# Patient Record
Sex: Female | Born: 1975 | Race: White | Hispanic: No | Marital: Married | Smoking: Never smoker
Health system: Southern US, Community
[De-identification: ages and names within clinical notes are randomized; demographics above are authoritative.]

## PROBLEM LIST (undated history)

## (undated) DIAGNOSIS — R002 Palpitations: Secondary | ICD-10-CM

## (undated) DIAGNOSIS — R000 Tachycardia, unspecified: Secondary | ICD-10-CM

## (undated) DIAGNOSIS — R945 Abnormal results of liver function studies: Secondary | ICD-10-CM

## (undated) DIAGNOSIS — J45909 Unspecified asthma, uncomplicated: Secondary | ICD-10-CM

## (undated) DIAGNOSIS — L680 Hirsutism: Secondary | ICD-10-CM

## (undated) DIAGNOSIS — K760 Fatty (change of) liver, not elsewhere classified: Secondary | ICD-10-CM

## (undated) DIAGNOSIS — K219 Gastro-esophageal reflux disease without esophagitis: Secondary | ICD-10-CM

## (undated) DIAGNOSIS — E538 Deficiency of other specified B group vitamins: Secondary | ICD-10-CM

## (undated) DIAGNOSIS — R7989 Other specified abnormal findings of blood chemistry: Secondary | ICD-10-CM

## (undated) DIAGNOSIS — F419 Anxiety disorder, unspecified: Secondary | ICD-10-CM

## (undated) DIAGNOSIS — N941 Unspecified dyspareunia: Secondary | ICD-10-CM

## (undated) HISTORY — DX: Gastro-esophageal reflux disease without esophagitis: K21.9

## (undated) HISTORY — PX: CHOLECYSTECTOMY: SHX55

## (undated) HISTORY — DX: Tachycardia, unspecified: R00.0

## (undated) HISTORY — DX: Fatty (change of) liver, not elsewhere classified: K76.0

## (undated) HISTORY — DX: Abnormal results of liver function studies: R94.5

## (undated) HISTORY — DX: Morbid (severe) obesity due to excess calories: E66.01

## (undated) HISTORY — DX: Other specified abnormal findings of blood chemistry: R79.89

## (undated) HISTORY — DX: Deficiency of other specified B group vitamins: E53.8

## (undated) HISTORY — DX: Unspecified dyspareunia: N94.10

## (undated) HISTORY — DX: Hirsutism: L68.0

## (undated) HISTORY — DX: Unspecified asthma, uncomplicated: J45.909

---

## 2002-01-10 ENCOUNTER — Ambulatory Visit (HOSPITAL_COMMUNITY): Admission: RE | Admit: 2002-01-10 | Discharge: 2002-01-10 | Payer: Self-pay | Admitting: Cardiology

## 2003-05-17 ENCOUNTER — Encounter: Admission: RE | Admit: 2003-05-17 | Discharge: 2003-05-17 | Payer: Self-pay | Admitting: Internal Medicine

## 2003-05-17 ENCOUNTER — Encounter: Payer: Self-pay | Admitting: Internal Medicine

## 2004-04-10 ENCOUNTER — Emergency Department (HOSPITAL_COMMUNITY): Admission: EM | Admit: 2004-04-10 | Discharge: 2004-04-10 | Payer: Self-pay | Admitting: *Deleted

## 2006-05-19 IMAGING — CR DG CHEST 2V
2 series · 2 of 2 positions shown · non-contrast
Comparison: none

CLINICAL DATA: Chest pain. 
 TWO-VIEW CHEST RADIOGRAPH, 04/10/04
 No prior studies.

[view not recorded (1 of 2)]
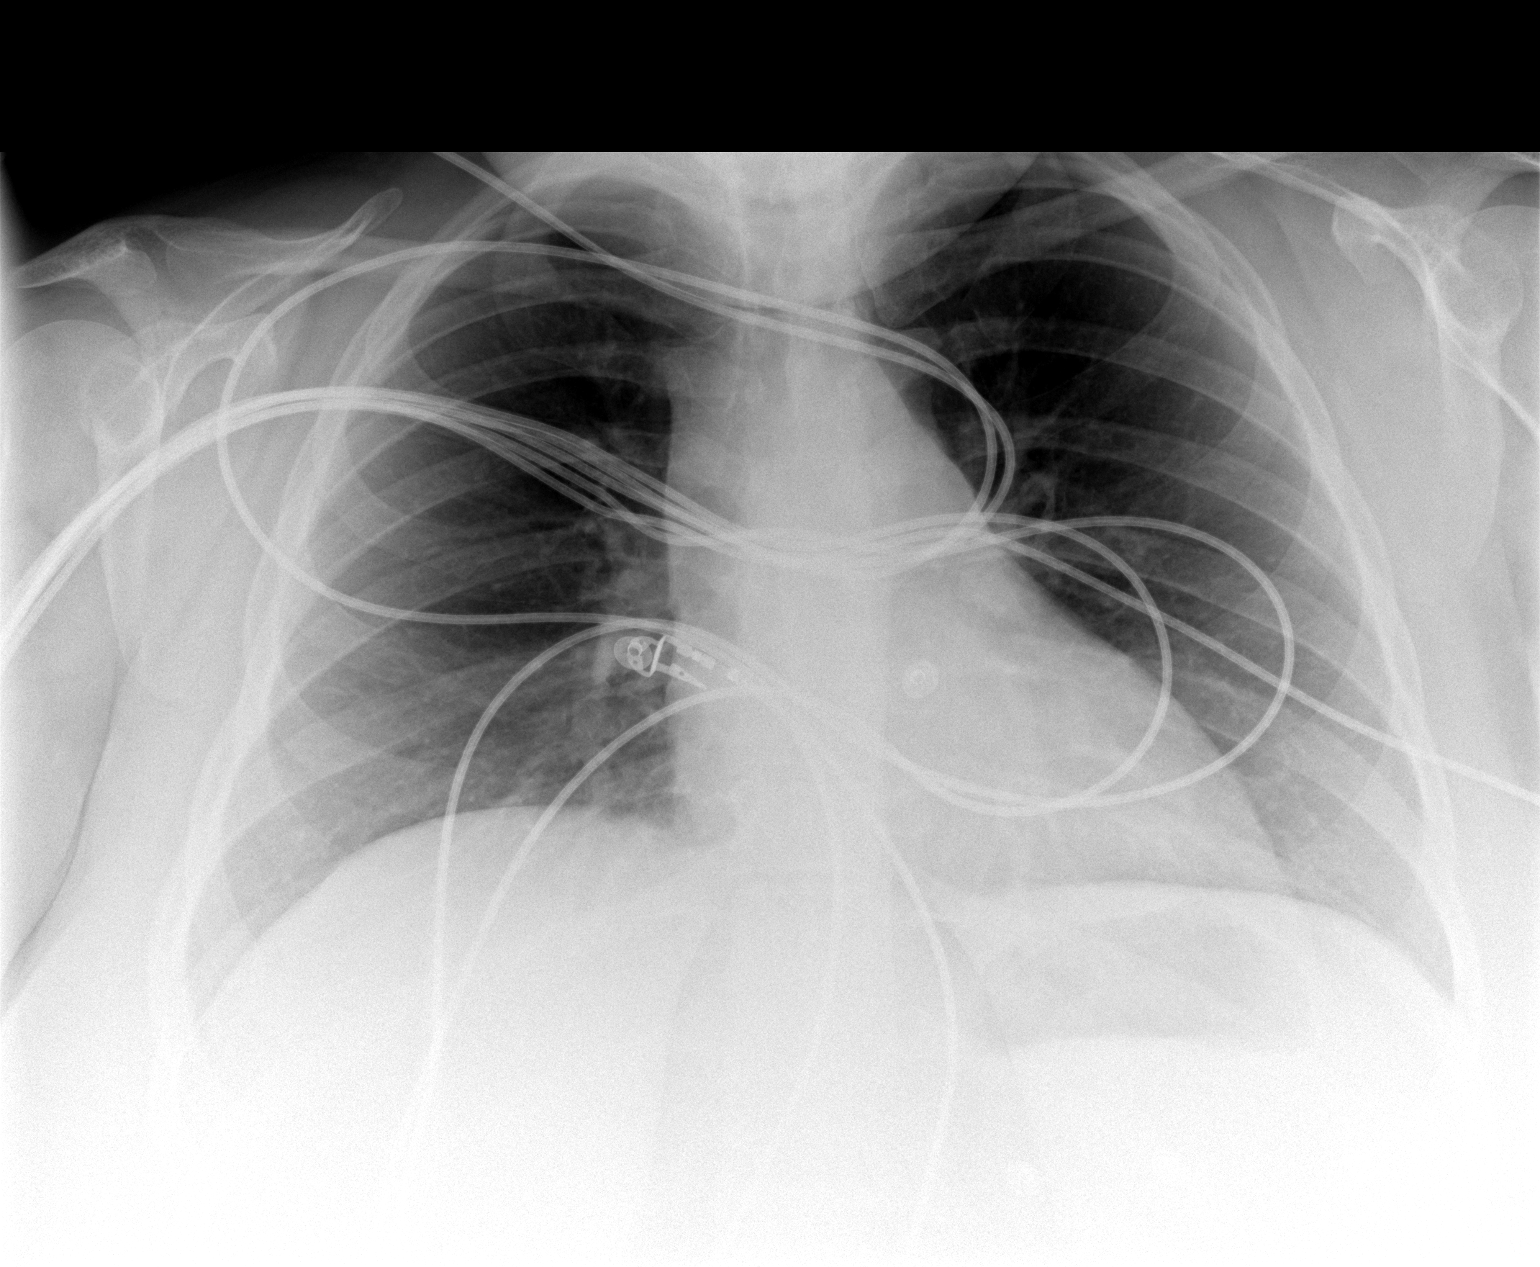

[view not recorded (2 of 2)]
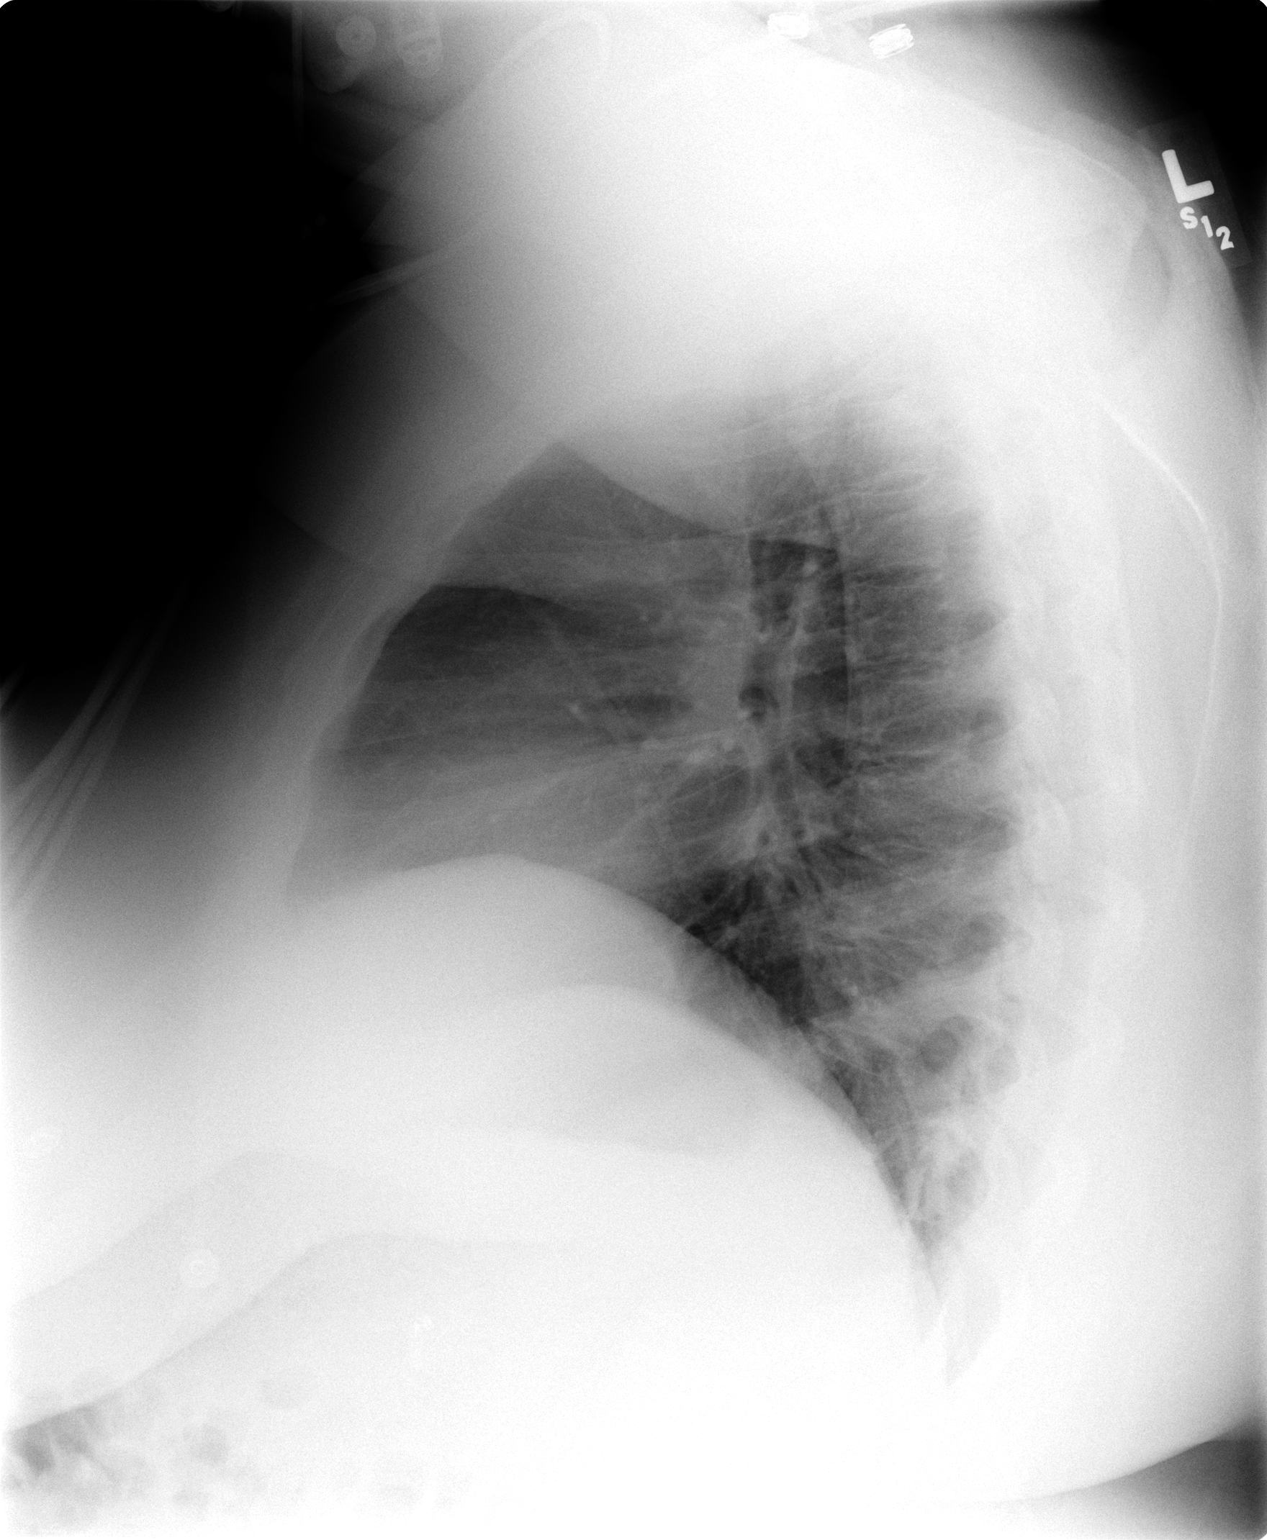

[2 of 2 positions shown; findings below may reference images not displayed]

FINDINGS: The heart size and mediastinal contours are normal. The lungs are clear. The visualized skeleton is unremarkable.
IMPRESSION: No active disease.

## 2016-08-17 DIAGNOSIS — R000 Tachycardia, unspecified: Secondary | ICD-10-CM | POA: Diagnosis not present

## 2016-08-17 DIAGNOSIS — K219 Gastro-esophageal reflux disease without esophagitis: Secondary | ICD-10-CM | POA: Diagnosis not present

## 2016-08-17 DIAGNOSIS — Z79899 Other long term (current) drug therapy: Secondary | ICD-10-CM | POA: Diagnosis not present

## 2016-08-17 DIAGNOSIS — F419 Anxiety disorder, unspecified: Secondary | ICD-10-CM | POA: Diagnosis not present

## 2016-08-17 DIAGNOSIS — N390 Urinary tract infection, site not specified: Secondary | ICD-10-CM | POA: Diagnosis not present

## 2016-08-31 ENCOUNTER — Other Ambulatory Visit: Payer: Self-pay

## 2016-08-31 ENCOUNTER — Emergency Department (HOSPITAL_COMMUNITY): Payer: BLUE CROSS/BLUE SHIELD

## 2016-08-31 ENCOUNTER — Encounter (HOSPITAL_COMMUNITY): Payer: Self-pay

## 2016-08-31 DIAGNOSIS — Z5321 Procedure and treatment not carried out due to patient leaving prior to being seen by health care provider: Secondary | ICD-10-CM | POA: Diagnosis not present

## 2016-08-31 DIAGNOSIS — E041 Nontoxic single thyroid nodule: Secondary | ICD-10-CM | POA: Diagnosis not present

## 2016-08-31 DIAGNOSIS — R079 Chest pain, unspecified: Secondary | ICD-10-CM | POA: Diagnosis not present

## 2016-08-31 DIAGNOSIS — R0789 Other chest pain: Secondary | ICD-10-CM | POA: Diagnosis not present

## 2016-08-31 DIAGNOSIS — R0600 Dyspnea, unspecified: Secondary | ICD-10-CM | POA: Diagnosis not present

## 2016-08-31 DIAGNOSIS — R0602 Shortness of breath: Secondary | ICD-10-CM | POA: Diagnosis not present

## 2016-08-31 LAB — CBC
HCT: 44.1 % (ref 36.0–46.0)
Hemoglobin: 14.6 g/dL (ref 12.0–15.0)
MCH: 29 pg (ref 26.0–34.0)
MCHC: 33.1 g/dL (ref 30.0–36.0)
MCV: 87.5 fL (ref 78.0–100.0)
PLATELETS: 287 10*3/uL (ref 150–400)
RBC: 5.04 MIL/uL (ref 3.87–5.11)
RDW: 14.1 % (ref 11.5–15.5)
WBC: 10 10*3/uL (ref 4.0–10.5)

## 2016-08-31 LAB — I-STAT TROPONIN, ED: Troponin i, poc: 0 ng/mL (ref 0.00–0.08)

## 2016-08-31 LAB — BASIC METABOLIC PANEL
Anion gap: 10 (ref 5–15)
BUN: 11 mg/dL (ref 6–20)
CHLORIDE: 108 mmol/L (ref 101–111)
CO2: 23 mmol/L (ref 22–32)
CREATININE: 0.88 mg/dL (ref 0.44–1.00)
Calcium: 9.5 mg/dL (ref 8.9–10.3)
GFR calc Af Amer: >60 mL/min (ref >60)
GFR calc non Af Amer: >60 mL/min (ref >60)
Glucose, Bld: 230 mg/dL — ABNORMAL HIGH (ref 65–99)
POTASSIUM: 3.7 mmol/L (ref 3.5–5.1)
SODIUM: 141 mmol/L (ref 135–145)

## 2016-08-31 NOTE — ED Triage Notes (Signed)
Pt complaining of central chest pain that radiates to L shoulder and neck. Pt states seen at [facility] for [facility]. Dx with chest wall pain. Pt states pain worsening today. Pt complaining of SOB. Pt denies any cough or injury/trauma.

## 2016-09-01 ENCOUNTER — Emergency Department (HOSPITAL_COMMUNITY)
Admission: EM | Admit: 2016-09-01 | Discharge: 2016-09-01 | Disposition: A | Discharge status: Home / Self Care | Payer: BLUE CROSS/BLUE SHIELD | Attending: Emergency Medicine | Admitting: Emergency Medicine

## 2016-09-01 HISTORY — DX: Palpitations: R00.2

## 2016-09-01 HISTORY — DX: Anxiety disorder, unspecified: F41.9

## 2016-09-03 DIAGNOSIS — Z6841 Body Mass Index (BMI) 40.0 and over, adult: Secondary | ICD-10-CM | POA: Diagnosis not present

## 2016-09-03 DIAGNOSIS — E041 Nontoxic single thyroid nodule: Secondary | ICD-10-CM | POA: Diagnosis not present

## 2016-09-03 DIAGNOSIS — Z1389 Encounter for screening for other disorder: Secondary | ICD-10-CM | POA: Diagnosis not present

## 2016-09-03 DIAGNOSIS — R0789 Other chest pain: Secondary | ICD-10-CM | POA: Diagnosis not present

## 2016-09-11 DIAGNOSIS — E042 Nontoxic multinodular goiter: Secondary | ICD-10-CM | POA: Diagnosis not present

## 2016-09-11 DIAGNOSIS — E041 Nontoxic single thyroid nodule: Secondary | ICD-10-CM | POA: Diagnosis not present

## 2016-10-01 DIAGNOSIS — E041 Nontoxic single thyroid nodule: Secondary | ICD-10-CM | POA: Diagnosis not present

## 2016-10-01 DIAGNOSIS — E042 Nontoxic multinodular goiter: Secondary | ICD-10-CM | POA: Diagnosis not present

## 2016-11-23 DIAGNOSIS — R062 Wheezing: Secondary | ICD-10-CM | POA: Diagnosis not present

## 2016-11-23 DIAGNOSIS — J209 Acute bronchitis, unspecified: Secondary | ICD-10-CM | POA: Diagnosis not present

## 2016-12-24 DIAGNOSIS — R5383 Other fatigue: Secondary | ICD-10-CM | POA: Diagnosis not present

## 2016-12-24 DIAGNOSIS — J4520 Mild intermittent asthma, uncomplicated: Secondary | ICD-10-CM | POA: Diagnosis not present

## 2016-12-24 DIAGNOSIS — E538 Deficiency of other specified B group vitamins: Secondary | ICD-10-CM | POA: Diagnosis not present

## 2016-12-24 DIAGNOSIS — N950 Postmenopausal bleeding: Secondary | ICD-10-CM | POA: Diagnosis not present

## 2016-12-25 DIAGNOSIS — E538 Deficiency of other specified B group vitamins: Secondary | ICD-10-CM | POA: Diagnosis not present

## 2016-12-25 DIAGNOSIS — R5383 Other fatigue: Secondary | ICD-10-CM | POA: Diagnosis not present

## 2017-01-22 DIAGNOSIS — K047 Periapical abscess without sinus: Secondary | ICD-10-CM | POA: Diagnosis not present

## 2017-01-22 DIAGNOSIS — Z6841 Body Mass Index (BMI) 40.0 and over, adult: Secondary | ICD-10-CM | POA: Diagnosis not present

## 2017-03-15 DIAGNOSIS — N939 Abnormal uterine and vaginal bleeding, unspecified: Secondary | ICD-10-CM | POA: Diagnosis not present

## 2017-03-29 DIAGNOSIS — N939 Abnormal uterine and vaginal bleeding, unspecified: Secondary | ICD-10-CM | POA: Diagnosis not present

## 2017-05-03 DIAGNOSIS — M79671 Pain in right foot: Secondary | ICD-10-CM | POA: Diagnosis not present

## 2017-05-04 DIAGNOSIS — M7731 Calcaneal spur, right foot: Secondary | ICD-10-CM | POA: Diagnosis not present

## 2017-05-04 DIAGNOSIS — M79671 Pain in right foot: Secondary | ICD-10-CM | POA: Diagnosis not present

## 2017-06-07 DIAGNOSIS — M79671 Pain in right foot: Secondary | ICD-10-CM | POA: Diagnosis not present

## 2017-07-15 DIAGNOSIS — H1033 Unspecified acute conjunctivitis, bilateral: Secondary | ICD-10-CM | POA: Diagnosis not present

## 2017-08-23 DIAGNOSIS — R0789 Other chest pain: Secondary | ICD-10-CM | POA: Diagnosis not present

## 2017-08-23 DIAGNOSIS — R000 Tachycardia, unspecified: Secondary | ICD-10-CM | POA: Diagnosis not present

## 2017-08-23 DIAGNOSIS — R5383 Other fatigue: Secondary | ICD-10-CM | POA: Diagnosis not present

## 2017-08-23 DIAGNOSIS — M791 Myalgia, unspecified site: Secondary | ICD-10-CM | POA: Diagnosis not present

## 2017-08-23 DIAGNOSIS — R635 Abnormal weight gain: Secondary | ICD-10-CM | POA: Diagnosis not present

## 2017-09-18 DIAGNOSIS — J111 Influenza due to unidentified influenza virus with other respiratory manifestations: Secondary | ICD-10-CM | POA: Diagnosis not present

## 2017-09-23 DIAGNOSIS — J4530 Mild persistent asthma, uncomplicated: Secondary | ICD-10-CM | POA: Diagnosis not present

## 2017-09-23 DIAGNOSIS — Z6841 Body Mass Index (BMI) 40.0 and over, adult: Secondary | ICD-10-CM | POA: Diagnosis not present

## 2017-09-23 DIAGNOSIS — R000 Tachycardia, unspecified: Secondary | ICD-10-CM | POA: Diagnosis not present

## 2017-09-23 DIAGNOSIS — R5383 Other fatigue: Secondary | ICD-10-CM | POA: Diagnosis not present

## 2017-09-23 DIAGNOSIS — Z1331 Encounter for screening for depression: Secondary | ICD-10-CM | POA: Diagnosis not present

## 2017-09-28 DIAGNOSIS — Z1231 Encounter for screening mammogram for malignant neoplasm of breast: Secondary | ICD-10-CM | POA: Diagnosis not present

## 2018-02-17 DIAGNOSIS — Z6841 Body Mass Index (BMI) 40.0 and over, adult: Secondary | ICD-10-CM | POA: Diagnosis not present

## 2018-02-17 DIAGNOSIS — F419 Anxiety disorder, unspecified: Secondary | ICD-10-CM | POA: Diagnosis not present

## 2018-02-17 DIAGNOSIS — R002 Palpitations: Secondary | ICD-10-CM | POA: Diagnosis not present

## 2018-02-17 DIAGNOSIS — J4530 Mild persistent asthma, uncomplicated: Secondary | ICD-10-CM | POA: Diagnosis not present

## 2018-02-28 ENCOUNTER — Encounter: Payer: Self-pay | Admitting: Cardiology

## 2018-03-04 DIAGNOSIS — N951 Menopausal and female climacteric states: Secondary | ICD-10-CM

## 2018-03-04 DIAGNOSIS — E538 Deficiency of other specified B group vitamins: Secondary | ICD-10-CM

## 2018-03-04 DIAGNOSIS — R7989 Other specified abnormal findings of blood chemistry: Secondary | ICD-10-CM | POA: Insufficient documentation

## 2018-03-04 DIAGNOSIS — R002 Palpitations: Secondary | ICD-10-CM

## 2018-03-04 DIAGNOSIS — R000 Tachycardia, unspecified: Secondary | ICD-10-CM

## 2018-03-04 DIAGNOSIS — N809 Endometriosis, unspecified: Secondary | ICD-10-CM

## 2018-03-04 DIAGNOSIS — K219 Gastro-esophageal reflux disease without esophagitis: Secondary | ICD-10-CM | POA: Insufficient documentation

## 2018-03-04 DIAGNOSIS — N941 Unspecified dyspareunia: Secondary | ICD-10-CM

## 2018-03-04 DIAGNOSIS — J4530 Mild persistent asthma, uncomplicated: Secondary | ICD-10-CM

## 2018-03-04 DIAGNOSIS — F419 Anxiety disorder, unspecified: Secondary | ICD-10-CM

## 2018-03-04 DIAGNOSIS — K760 Fatty (change of) liver, not elsewhere classified: Secondary | ICD-10-CM

## 2018-03-04 DIAGNOSIS — L680 Hirsutism: Secondary | ICD-10-CM

## 2018-03-04 DIAGNOSIS — R945 Abnormal results of liver function studies: Secondary | ICD-10-CM

## 2018-03-04 HISTORY — DX: Mild persistent asthma, uncomplicated: J45.30

## 2018-03-04 HISTORY — DX: Menopausal and female climacteric states: N95.1

## 2018-03-04 HISTORY — DX: Anxiety disorder, unspecified: F41.9

## 2018-03-04 HISTORY — DX: Fatty (change of) liver, not elsewhere classified: K76.0

## 2018-03-04 HISTORY — DX: Unspecified dyspareunia: N94.10

## 2018-03-04 HISTORY — DX: Palpitations: R00.2

## 2018-03-04 HISTORY — DX: Deficiency of other specified B group vitamins: E53.8

## 2018-03-04 HISTORY — DX: Endometriosis, unspecified: N80.9

## 2018-03-08 ENCOUNTER — Ambulatory Visit (INDEPENDENT_AMBULATORY_CARE_PROVIDER_SITE_OTHER): Payer: BLUE CROSS/BLUE SHIELD | Admitting: Cardiology

## 2018-03-08 ENCOUNTER — Encounter: Payer: Self-pay | Admitting: Cardiology

## 2018-03-08 VITALS — BP 126/72 | HR 65 | Ht 65.0 in | Wt 327.0 lb

## 2018-03-08 DIAGNOSIS — R002 Palpitations: Secondary | ICD-10-CM | POA: Diagnosis not present

## 2018-03-08 DIAGNOSIS — R079 Chest pain, unspecified: Secondary | ICD-10-CM

## 2018-03-08 HISTORY — DX: Palpitations: R00.2

## 2018-03-08 HISTORY — DX: Chest pain, unspecified: R07.9

## 2018-03-08 NOTE — Addendum Note (Signed)
Addended by: Lamona CurlOUTH, NICOLE H on: 03/08/2018 11:16 AM   Modules accepted: Orders

## 2018-03-08 NOTE — Patient Instructions (Addendum)
Medication Instructions:  Your physician recommends that you continue on your current medications as directed. Please refer to the Current Medication list given to you today.   Labwork: You will have a TSH today  Testing/Procedures: Your physician has requested that you have an echocardiogram. Echocardiography is a painless test that uses sound waves to create images of your heart. It provides your doctor with information about the size and shape of your heart and how well your heart's chambers and valves are working. This procedure takes approximately one hour. There are no restrictions for this procedure.  Your physician has recommended that you wear a holter monitor. Holter monitors are medical devices that record the heart's electrical activity. Doctors most often use these monitors to diagnose arrhythmias. Arrhythmias are problems with the speed or rhythm of the heartbeat. The monitor is a small, portable device. You can wear one while you do your normal daily activities. This is usually used to diagnose what is causing palpitations/syncope (passing out).  Your physician has requested that you have a lexiscan myoview. For further information please visit [email or web address]. Please follow instruction sheet, as given.  Stress Test Directions for [facility]: 1.) Please check in at the outpatient center at [facility] the day of your testing. 2.) Nothing to eat or drink after midnight prior to testing. You may take your medications that morning with water. 3.) Please be aware that the test can take up to 3-4 hours. This is a 2 day test process and you will follow the same instructions for both days.  4.) Should you have any problem with the appointment date or time, please call [phone].    Follow-Up: Your physician wants you to follow-up in: 6 months.  You will receive a reminder letter in the mail two months in advance. If you don't receive a letter, please call our office  to schedule the follow-up appointment.   Any Other Special Instructions Will Be Listed Below (If Applicable).     If you need a refill on your cardiac medications before your next appointment, please call your pharmacy.

## 2018-03-08 NOTE — Progress Notes (Signed)
Cardiology Office Note:    Date:  03/08/2018   ID:  Larkin Ina, DOB 1975-11-08, MRN [id number]  PCP:  Lonie Peak, PA-C  Cardiologist:  Garwin Brothers, MD   Referring MD: Lonie Peak, PA-C    ASSESSMENT:    1. Palpitations   2. Chest pain, unspecified type   3. Morbid obesity (HCC)    PLAN:    In order of problems listed above:  1. I discussed my findings with the patient at extensive length.  Primary prevention stressed with her.  Importance of compliance with diet and medication stressed and she vocalized understanding. 2. Her blood pressure is stable.  Diet was explained for obesity and risks of obesity explained and she is doing and trying her to reduce weight. 3. In view of her palpitations she will have a TSH and a 48-hour Holter monitor.  The last time she had a TSH was in January and it was fine.   4.  To evaluate chest pain she will undergo exercise stress Cardiolite. 5. Patient will be seen in follow-up appointment in 6 months or earlier if the patient has any concerns    Medication Adjustments/Labs and Tests Ordered: Current medicines are reviewed at length with the patient today.  Concerns regarding medicines are outlined above.  No orders of the defined types were placed in this encounter.  No orders of the defined types were placed in this encounter.    History of Present Illness:    Pamela Silva is a 42 y.o. female who is being seen today for the evaluation of palpitations and chest pain at the request of Lonie Peak, PA-C.  Patient is a pleasant 42 year old female.  She has past medical history of morbid obesity.  She is here because of palpitations.  She tells me that it happens to her on a daily basis and she is concerned about.  No orthopnea or PND.  She is trying to cut down on her soft drinks and reduce her weight.  She complains of occasional chest pain not related to exertion.  At the time of my evaluation, the patient is alert awake  oriented and in no distress.  Past Medical History:  Diagnosis Date  . Abnormal LFTs   . Anxiety   . Asthma   . Dyspareunia in female   . Esophageal reflux   . Fatty liver   . Female hirsutism   . Heart palpitations   . Morbid obesity (HCC)   . Sinus tachycardia   . Vitamin B 12 deficiency     Past Surgical History:  Procedure Laterality Date  . CHOLECYSTECTOMY      Current Medications: Current Meds  Medication Sig  . albuterol (PROAIR HFA) 108 (90 Base) MCG/ACT inhaler Inhale 2 puffs into the lungs every 4 (four) hours as needed.   . ALPRAZolam (XANAX) 0.5 MG tablet Take 0.5 mg by mouth at bedtime as needed.  Marland Kitchen FLOVENT HFA 110 MCG/ACT inhaler Inhale 2 puffs into the lungs 2 (two) times daily.  . fluticasone (FLONASE) 50 MCG/ACT nasal spray Place 2 sprays into both nostrils daily.  . metoprolol tartrate (LOPRESSOR) 25 MG tablet Take 1.5 tablets by mouth daily.  Marland Kitchen omeprazole (PRILOSEC) 40 MG capsule Take 40 mg by mouth daily.     Allergies:   Codeine; Hydrocodone-acetaminophen; and Morphine and related   Social History   Socioeconomic History  . Marital status: Married    Spouse name: Not on file  . Number of children:  Not on file  . Years of education: Not on file  . Highest education level: Not on file  Occupational History  . Not on file  Social Needs  . Financial resource strain: Not on file  . Food insecurity:    Worry: Not on file    Inability: Not on file  . Transportation needs:    Medical: Not on file    Non-medical: Not on file  Tobacco Use  . Smoking status: Never Smoker  . Smokeless tobacco: Never Used  Substance and Sexual Activity  . Alcohol use: No  . Drug use: No  . Sexual activity: Not on file  Lifestyle  . Physical activity:    Days per week: Not on file    Minutes per session: Not on file  . Stress: Not on file  Relationships  . Social connections:    Talks on phone: Not on file    Gets together: Not on file    Attends religious  service: Not on file    Active member of club or organization: Not on file    Attends meetings of clubs or organizations: Not on file    Relationship status: Not on file  Other Topics Concern  . Not on file  Social History Narrative  . Not on file     Family History: The patient's family history includes Heart attack in her mother; Lung cancer in her mother.  ROS:   Please see the history of present illness.    All other systems reviewed and are negative.  EKGs/Labs/Other Studies Reviewed:    The following studies were reviewed today: EKG reveals sinus rhythm with nonspecific ST changes   Recent Labs: No results found for requested labs within last 8760 hours.  Recent Lipid Panel No results found for: CHOL, TRIG, HDL, CHOLHDL, VLDL, LDLCALC, LDLDIRECT  Physical Exam:    VS:  BP 126/72 (BP Location: Right Arm, Patient Position: Sitting, Cuff Size: Normal)   Pulse 65   Ht 5\' 5"  (1.651 m)   Wt (!) 327 lb (148.3 kg)   SpO2 98%   BMI 54.42 kg/m     Wt Readings from Last 3 Encounters:  03/08/18 (!) 327 lb (148.3 kg)  02/18/18 (!) 338 lb (153.3 kg)     GEN: Patient is in no acute distress HEENT: Normal NECK: No JVD; No carotid bruits LYMPHATICS: No lymphadenopathy CARDIAC: S1 S2 regular, 2/6 systolic murmur at the apex. RESPIRATORY:  Clear to auscultation without rales, wheezing or rhonchi  ABDOMEN: Soft, non-tender, non-distended MUSCULOSKELETAL:  No edema; No deformity  SKIN: Warm and dry NEUROLOGIC:  Alert and oriented x 3 PSYCHIATRIC:  Normal affect    Signed, Garwin Brothersajan R Revankar, MD  03/08/2018 10:52 AM    [facility]

## 2018-03-09 LAB — TSH: TSH: 3.07 u[IU]/mL (ref 0.450–4.500)

## 2018-03-17 DIAGNOSIS — G5700 Lesion of sciatic nerve, unspecified lower limb: Secondary | ICD-10-CM | POA: Diagnosis not present

## 2018-04-06 DIAGNOSIS — R079 Chest pain, unspecified: Secondary | ICD-10-CM | POA: Diagnosis not present

## 2018-04-07 ENCOUNTER — Telehealth: Payer: Self-pay | Admitting: *Deleted

## 2018-04-07 NOTE — Telephone Encounter (Signed)
Informed patient of her myoview results.

## 2018-04-07 NOTE — Telephone Encounter (Signed)
Left voicemail for the patient to call the office. 

## 2018-04-07 NOTE — Telephone Encounter (Signed)
Pt would like results of stress test 

## 2018-04-13 ENCOUNTER — Ambulatory Visit (INDEPENDENT_AMBULATORY_CARE_PROVIDER_SITE_OTHER): Payer: BLUE CROSS/BLUE SHIELD

## 2018-04-13 DIAGNOSIS — R002 Palpitations: Secondary | ICD-10-CM | POA: Diagnosis not present

## 2018-04-15 ENCOUNTER — Ambulatory Visit (INDEPENDENT_AMBULATORY_CARE_PROVIDER_SITE_OTHER): Payer: BLUE CROSS/BLUE SHIELD

## 2018-04-15 DIAGNOSIS — R002 Palpitations: Secondary | ICD-10-CM

## 2018-04-15 NOTE — Progress Notes (Signed)
Complete echocardiogram has been performed. Ultrasound contrast use was needed, IV access was attempted x2 without success.   Jimmy Layana Konkel RDCS

## 2018-05-12 DIAGNOSIS — Z6841 Body Mass Index (BMI) 40.0 and over, adult: Secondary | ICD-10-CM | POA: Diagnosis not present

## 2018-05-12 DIAGNOSIS — J0100 Acute maxillary sinusitis, unspecified: Secondary | ICD-10-CM | POA: Diagnosis not present

## 2018-06-14 DIAGNOSIS — Z23 Encounter for immunization: Secondary | ICD-10-CM | POA: Diagnosis not present

## 2018-06-14 DIAGNOSIS — Z6841 Body Mass Index (BMI) 40.0 and over, adult: Secondary | ICD-10-CM | POA: Diagnosis not present

## 2018-06-14 DIAGNOSIS — R002 Palpitations: Secondary | ICD-10-CM | POA: Diagnosis not present

## 2018-08-31 DIAGNOSIS — I10 Essential (primary) hypertension: Secondary | ICD-10-CM | POA: Diagnosis not present

## 2018-08-31 DIAGNOSIS — K219 Gastro-esophageal reflux disease without esophagitis: Secondary | ICD-10-CM | POA: Diagnosis not present

## 2018-08-31 DIAGNOSIS — Z8639 Personal history of other endocrine, nutritional and metabolic disease: Secondary | ICD-10-CM | POA: Diagnosis not present

## 2018-08-31 DIAGNOSIS — E782 Mixed hyperlipidemia: Secondary | ICD-10-CM | POA: Diagnosis not present

## 2018-08-31 DIAGNOSIS — R5381 Other malaise: Secondary | ICD-10-CM | POA: Diagnosis not present

## 2018-08-31 DIAGNOSIS — Z0000 Encounter for general adult medical examination without abnormal findings: Secondary | ICD-10-CM | POA: Diagnosis not present

## 2018-08-31 DIAGNOSIS — R5383 Other fatigue: Secondary | ICD-10-CM | POA: Diagnosis not present

## 2018-09-28 DIAGNOSIS — L600 Ingrowing nail: Secondary | ICD-10-CM | POA: Diagnosis not present

## 2018-09-28 DIAGNOSIS — M722 Plantar fascial fibromatosis: Secondary | ICD-10-CM | POA: Diagnosis not present

## 2018-09-28 DIAGNOSIS — L608 Other nail disorders: Secondary | ICD-10-CM | POA: Diagnosis not present

## 2018-10-03 DIAGNOSIS — R1031 Right lower quadrant pain: Secondary | ICD-10-CM | POA: Diagnosis not present

## 2018-10-03 DIAGNOSIS — Z79899 Other long term (current) drug therapy: Secondary | ICD-10-CM | POA: Diagnosis not present

## 2018-10-03 DIAGNOSIS — R112 Nausea with vomiting, unspecified: Secondary | ICD-10-CM | POA: Diagnosis not present

## 2018-10-03 DIAGNOSIS — M545 Low back pain: Secondary | ICD-10-CM | POA: Diagnosis not present

## 2018-10-03 DIAGNOSIS — J45909 Unspecified asthma, uncomplicated: Secondary | ICD-10-CM | POA: Diagnosis not present

## 2018-10-03 DIAGNOSIS — R109 Unspecified abdominal pain: Secondary | ICD-10-CM | POA: Diagnosis not present

## 2018-10-09 IMAGING — CR DG CHEST 2V
2 series · 2 of 2 positions shown · non-contrast
Comparison: 08/31/1998 any

CLINICAL DATA: Obesity, chest pain

EXAM:
CHEST  2 VIEW

[chest pa]
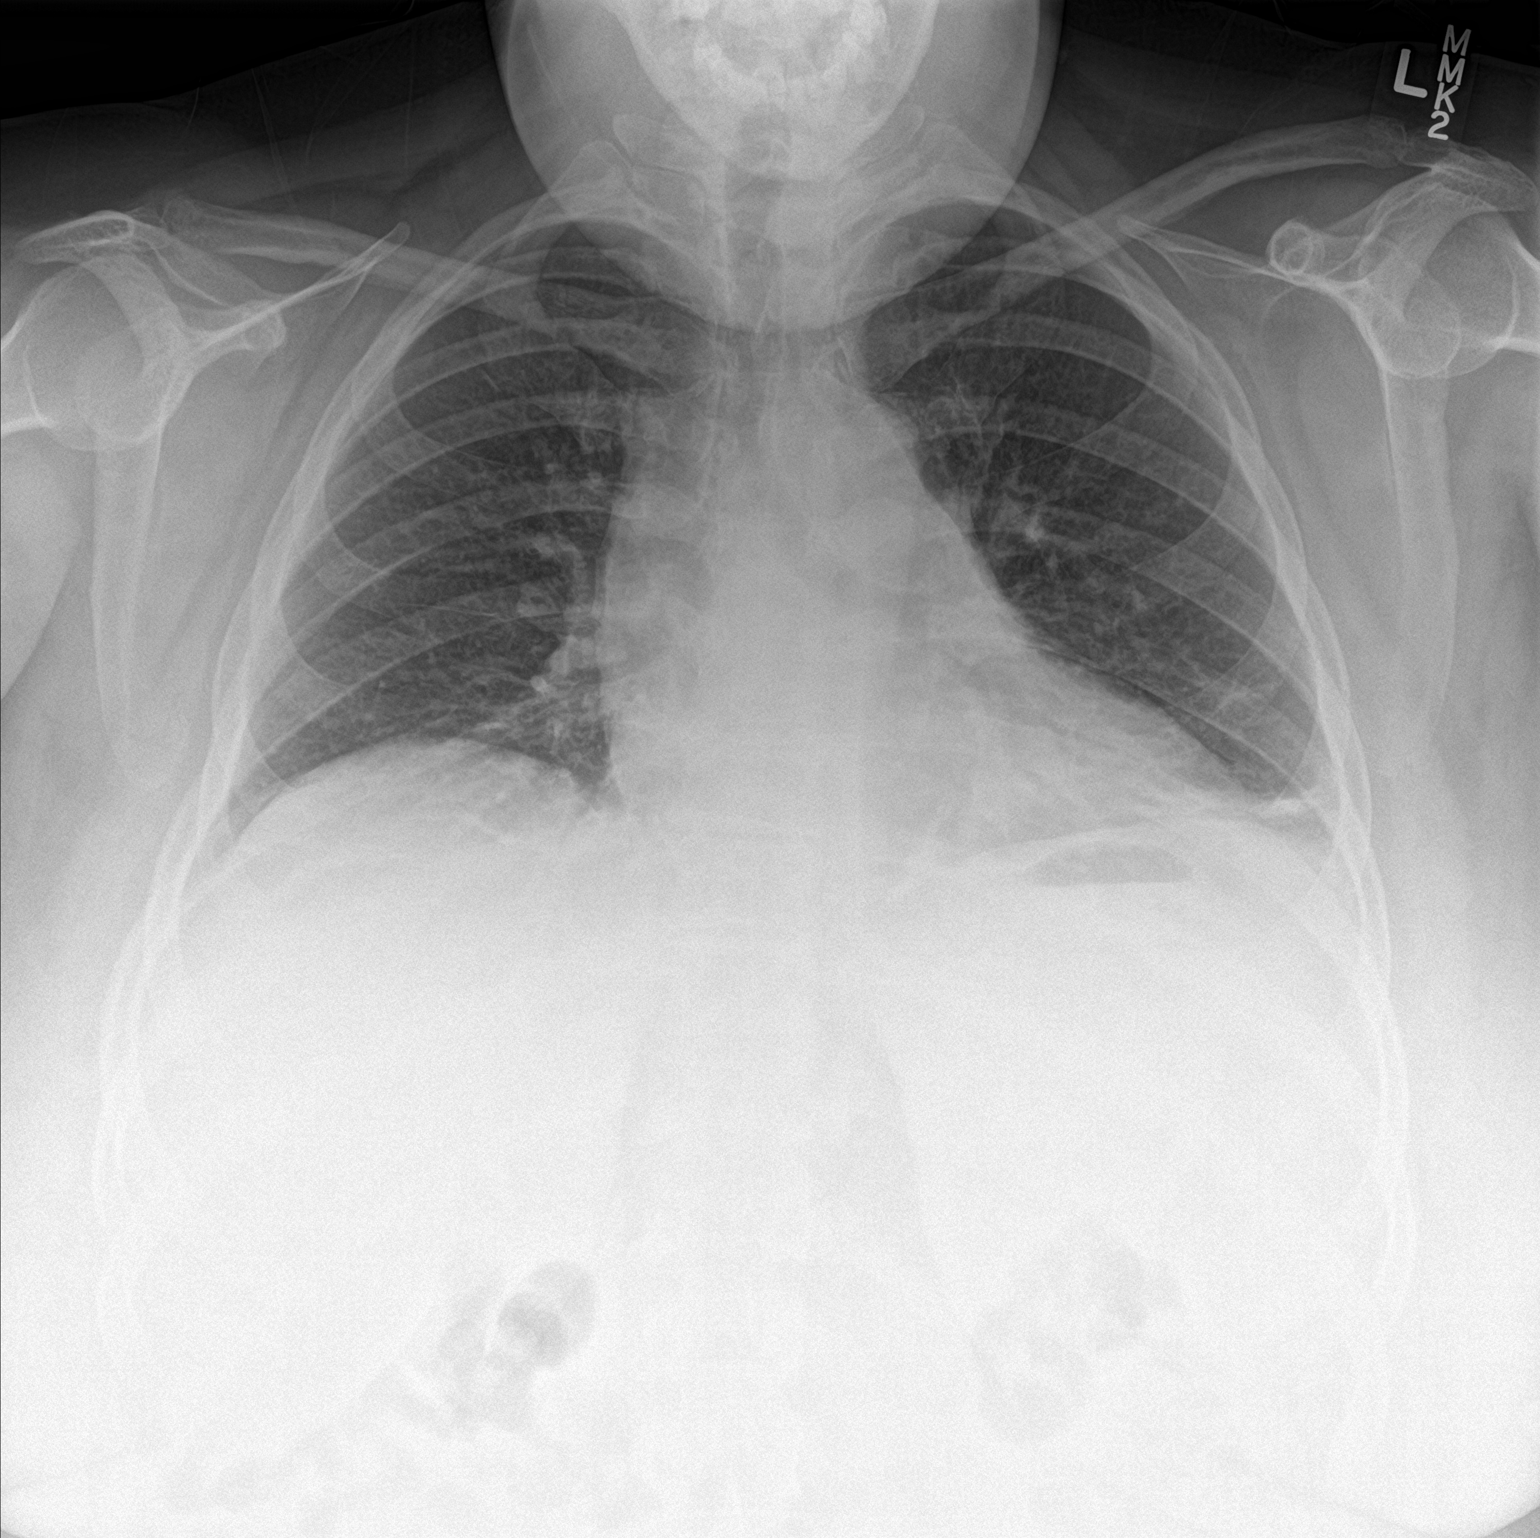

[chest lat]
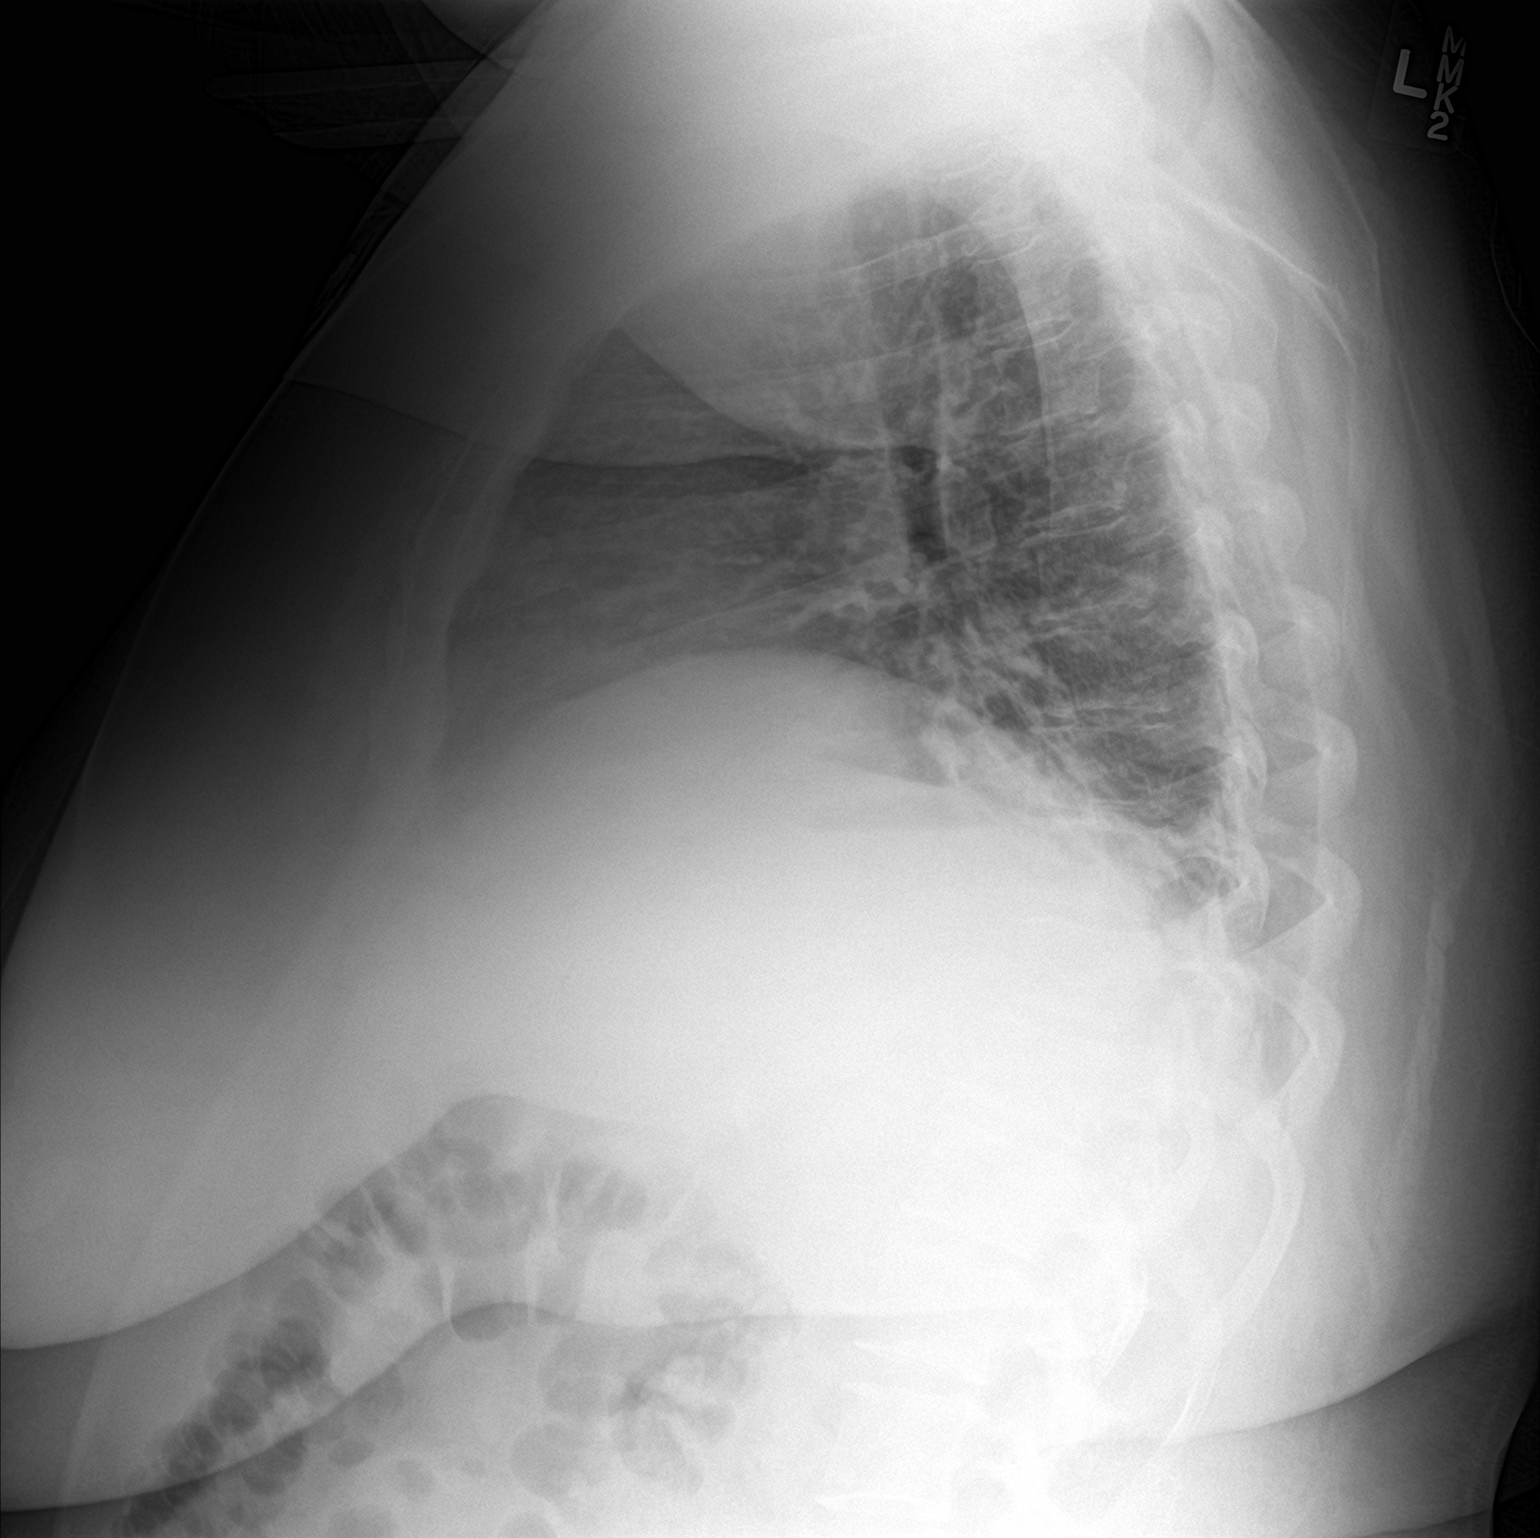

[2 of 2 positions shown; findings below may reference images not displayed]

FINDINGS: Persistent low lung volumes with left greater than right basilar
atelectasis. Mild cardiac enlargement. No definite focal pneumonia,
collapse, consolidation, edema, effusion or pneumothorax. Trachea is
midline. No acute osseous finding. Normal bowel gas pattern.
IMPRESSION: Low volume exam with bibasilar atelectasis.

Cardiomegaly without CHF

## 2018-10-25 DIAGNOSIS — H5500 Unspecified nystagmus: Secondary | ICD-10-CM | POA: Diagnosis not present

## 2018-10-25 DIAGNOSIS — B9789 Other viral agents as the cause of diseases classified elsewhere: Secondary | ICD-10-CM | POA: Diagnosis not present

## 2018-10-25 DIAGNOSIS — J988 Other specified respiratory disorders: Secondary | ICD-10-CM | POA: Diagnosis not present

## 2018-10-25 DIAGNOSIS — R42 Dizziness and giddiness: Secondary | ICD-10-CM | POA: Diagnosis not present

## 2018-10-25 DIAGNOSIS — B349 Viral infection, unspecified: Secondary | ICD-10-CM | POA: Diagnosis not present

## 2018-10-25 DIAGNOSIS — R112 Nausea with vomiting, unspecified: Secondary | ICD-10-CM | POA: Diagnosis not present

## 2018-10-26 DIAGNOSIS — R42 Dizziness and giddiness: Secondary | ICD-10-CM | POA: Diagnosis not present

## 2018-10-30 DIAGNOSIS — I639 Cerebral infarction, unspecified: Secondary | ICD-10-CM | POA: Diagnosis not present

## 2018-10-30 DIAGNOSIS — H532 Diplopia: Secondary | ICD-10-CM | POA: Diagnosis not present

## 2018-10-30 DIAGNOSIS — I6350 Cerebral infarction due to unspecified occlusion or stenosis of unspecified cerebral artery: Secondary | ICD-10-CM | POA: Diagnosis not present

## 2018-10-30 DIAGNOSIS — G935 Compression of brain: Secondary | ICD-10-CM | POA: Diagnosis not present

## 2018-10-30 DIAGNOSIS — G529 Cranial nerve disorder, unspecified: Secondary | ICD-10-CM | POA: Diagnosis not present

## 2018-10-30 DIAGNOSIS — R51 Headache: Secondary | ICD-10-CM | POA: Diagnosis not present

## 2018-10-30 DIAGNOSIS — R42 Dizziness and giddiness: Secondary | ICD-10-CM | POA: Diagnosis not present

## 2018-10-31 DIAGNOSIS — Z7189 Other specified counseling: Secondary | ICD-10-CM | POA: Diagnosis not present

## 2018-10-31 DIAGNOSIS — Z20828 Contact with and (suspected) exposure to other viral communicable diseases: Secondary | ICD-10-CM | POA: Diagnosis not present

## 2018-10-31 DIAGNOSIS — E782 Mixed hyperlipidemia: Secondary | ICD-10-CM | POA: Diagnosis not present

## 2018-10-31 DIAGNOSIS — I10 Essential (primary) hypertension: Secondary | ICD-10-CM | POA: Diagnosis not present

## 2018-11-23 DIAGNOSIS — H5500 Unspecified nystagmus: Secondary | ICD-10-CM | POA: Diagnosis not present

## 2018-11-23 DIAGNOSIS — G43109 Migraine with aura, not intractable, without status migrainosus: Secondary | ICD-10-CM | POA: Diagnosis not present

## 2018-11-23 DIAGNOSIS — F419 Anxiety disorder, unspecified: Secondary | ICD-10-CM | POA: Diagnosis not present

## 2018-11-23 DIAGNOSIS — H532 Diplopia: Secondary | ICD-10-CM | POA: Diagnosis not present

## 2018-11-30 ENCOUNTER — Telehealth: Payer: Self-pay | Admitting: Cardiology

## 2018-11-30 NOTE — Telephone Encounter (Signed)
Virtual Visit Pre-Appointment Phone Call  "(Name), I am calling you today to discuss your upcoming appointment. We are currently trying to limit exposure to the virus that causes COVID-19 by seeing patients at home rather than in the office."  1. "What is the BEST phone number to call the day of the visit?" - include this in appointment notes  2. Do you have or have access to (through a family member/friend) a smartphone with video capability that we can use for your visit?" a. If yes - list this number in appt notes as cell (if different from BEST phone #) and list the appointment type as a VIDEO visit in appointment notes b. If no - list the appointment type as a PHONE visit in appointment notes  3. Confirm consent - "In the setting of the current Covid19 crisis, you are scheduled for a (phone or video) visit with your provider on (date) at (time).  Just as we do with many in-office visits, in order for you to participate in this visit, we must obtain consent.  If you'd like, I can send this to your mychart (if signed up) or email for you to review.  Otherwise, I can obtain your verbal consent now.  All virtual visits are billed to your insurance company just like a normal visit would be.  By agreeing to a virtual visit, we'd like you to understand that the technology does not allow for your provider to perform an examination, and thus may limit your provider's ability to fully assess your condition. If your provider identifies any concerns that need to be evaluated in person, we will make arrangements to do so.  Finally, though the technology is pretty good, we cannot assure that it will always work on either your or our end, and in the setting of a video visit, we may have to convert it to a phone-only visit.  In either situation, we cannot ensure that we have a secure connection.  Are you willing to proceed?" STAFF: Did the patient verbally acknowledge consent to telehealth visit? Document  YES/NO here: YES  4. Advise patient to be prepared - "Two hours prior to your appointment, go ahead and check your blood pressure, pulse, oxygen saturation, and your weight (if you have the equipment to check those) and write them all down. When your visit starts, your provider will ask you for this information. If you have an Apple Watch or Kardia device, please plan to have heart rate information ready on the day of your appointment. Please have a pen and paper handy nearby the day of the visit as well."  5. Give patient instructions for MyChart download to smartphone OR [email or web address].me as below if video visit (depending on what platform provider is using)  6. Inform patient they will receive a phone call 15 minutes prior to their appointment time (may be from unknown caller ID) so they should be prepared to answer    TELEPHONE CALL NOTE  Pamela Silva has been deemed a candidate for a follow-up tele-health visit to limit community exposure during the Covid-19 pandemic. I spoke with the patient via phone to ensure availability of phone/video source, confirm preferred email & phone number, and discuss instructions and expectations.  I reminded Pamela InaLisa A Silva to be prepared with any vital sign and/or heart rhythm information that could potentially be obtained via home monitoring, at the time of her visit. I reminded Pamela Silva to expect a phone call prior to  her visit.  ZAMYA KREUSER 11/30/2018 10:13 AM   INSTRUCTIONS FOR DOWNLOADING THE MYCHART APP TO SMARTPHONE  - The patient must first make sure to have activated MyChart and know their login information - If Apple, go to [employer] and type in MyChart in the search bar and download the app. If Android, ask patient to go to [employer] and type in [place] in the search bar and download the app. The app is free but as with any other app downloads, their phone may require them to verify saved payment information or Apple/Android  password.  - The patient will need to then log into the app with their MyChart username and password, and select [facility] as their healthcare provider to link the account. When it is time for your visit, go to the MyChart app, find appointments, and click Begin Video Visit. Be sure to Select Allow for your device to access the Microphone and Camera for your visit. You will then be connected, and your provider will be with you shortly.  **If they have any issues connecting, or need assistance please contact MyChart service desk [phone])**  **If using a computer, in order to ensure the best quality for their visit they will need to use either of the following Internet Browsers: [employer] or Google Chrome**  IF USING DOXIMITY or DOXY.ME - The patient will receive a link just prior to their visit by text.     FULL LENGTH CONSENT FOR TELE-HEALTH VISIT   I hereby voluntarily request, consent and authorize [facility] and its employed or contracted physicians, physician assistants, nurse practitioners or other licensed health care professionals (the Practitioner), to provide me with telemedicine health care services (the Services") as deemed necessary by the treating Practitioner. I acknowledge and consent to receive the Services by the Practitioner via telemedicine. I understand that the telemedicine visit will involve communicating with the Practitioner through live audiovisual communication technology and the disclosure of certain medical information by electronic transmission. I acknowledge that I have been given the opportunity to request an in-person assessment or other available alternative prior to the telemedicine visit and am voluntarily participating in the telemedicine visit.  I understand that I have the right to withhold or withdraw my consent to the use of telemedicine in the course of my care at any time, without affecting my right to future care or treatment,  and that the Practitioner or I may terminate the telemedicine visit at any time. I understand that I have the right to inspect all information obtained and/or recorded in the course of the telemedicine visit and may receive copies of available information for a reasonable fee.  I understand that some of the potential risks of receiving the Services via telemedicine include:   Delay or interruption in medical evaluation due to technological equipment failure or disruption;  Information transmitted may not be sufficient (e.g. poor resolution of images) to allow for appropriate medical decision making by the Practitioner; and/or   In rare instances, security protocols could fail, causing a breach of personal health information.  Furthermore, I acknowledge that it is my responsibility to provide information about my medical history, conditions and care that is complete and accurate to the best of my ability. I acknowledge that Practitioner's advice, recommendations, and/or decision may be based on factors not within their control, such as incomplete or inaccurate data provided by me or distortions of diagnostic images or specimens that may result from electronic transmissions. I  understand that the practice of medicine is not an exact science and that Practitioner makes no warranties or guarantees regarding treatment outcomes. I acknowledge that I will receive a copy of this consent concurrently upon execution via email to the email address I last provided but may also request a printed copy by calling the office of [place].    I understand that my insurance will be billed for this visit.   I have read or had this consent read to me.  I understand the contents of this consent, which adequately explains the benefits and risks of the Services being provided via telemedicine.   I have been provided ample opportunity to ask questions regarding this consent and the Services and have had my questions  answered to my satisfaction.  I give my informed consent for the services to be provided through the use of telemedicine in my medical care  By participating in this telemedicine visit I agree to the above.

## 2018-12-02 ENCOUNTER — Encounter: Payer: Self-pay | Admitting: Cardiology

## 2018-12-02 ENCOUNTER — Other Ambulatory Visit: Payer: Self-pay

## 2018-12-02 ENCOUNTER — Telehealth (INDEPENDENT_AMBULATORY_CARE_PROVIDER_SITE_OTHER): Payer: BLUE CROSS/BLUE SHIELD | Admitting: Cardiology

## 2018-12-02 VITALS — Ht 65.0 in | Wt 307.0 lb

## 2018-12-02 DIAGNOSIS — R002 Palpitations: Secondary | ICD-10-CM | POA: Diagnosis not present

## 2018-12-02 MED ORDER — METOPROLOL TARTRATE 25 MG PO TABS: 25.0000 mg | ORAL_TABLET | Freq: Every day | ORAL | 2 refills | Status: DC

## 2018-12-02 NOTE — Patient Instructions (Signed)
Medication Instructions:  INCREASE metoprolol to 25 mg (1 tablet) daily  If you need a refill on your cardiac medications before your next appointment, please call your pharmacy.   Lab work: NONE If you have labs (blood work) drawn today and your tests are completely normal, you will receive your results only by: Marland Kitchen MyChart Message (if you have MyChart) OR . A paper copy in the mail If you have any lab test that is abnormal or we need to change your treatment, we will call you to review the results.  Testing/Procedures: NONE  Follow-Up: At [facility], you and your health needs are our priority.  As part of our continuing mission to provide you with exceptional heart care, we have created designated Provider Care Teams.  These Care Teams include your primary Cardiologist (physician) and Advanced Practice Providers (APPs -  Physician Assistants and Nurse Practitioners) who all work together to provide you with the care you need, when you need it. You will need a follow up appointment in 3 months.

## 2018-12-02 NOTE — Addendum Note (Signed)
Addended by: Pamala Hurry on: 12/02/2018 09:45 AM   Modules accepted: Orders

## 2018-12-02 NOTE — Progress Notes (Signed)
Virtual Visit via Video Note   This visit type was conducted due to national recommendations for restrictions regarding the COVID-19 Pandemic (e.g. social distancing) in an effort to limit this patient's exposure and mitigate transmission in our community.  Due to her co-morbid illnesses, this patient is at least at moderate risk for complications without adequate follow up.  This format is felt to be most appropriate for this patient at this time.  All issues noted in this document were discussed and addressed.  A limited physical exam was performed with this format.  Please refer to the patient's chart for her consent to telehealth for [facility].   Date:  12/02/2018   ID:  Pamela Silva, DOB 01/30/1976, MRN [id number]  Patient Location: Home Provider Location: Home  PCP:  Lonie Peakonroy, Nathan, PA-C  Cardiologist:  No primary care provider on file.  Electrophysiologist:  None   Evaluation Performed:  Follow-Up Visit  Chief Complaint: Palpitations  History of Present Illness:    Pamela InaLisa A Lomas is a 43 y.o. female with history of essential hypertension and morbid obesity she was evaluated for shortness of breath and palpitations.  Her echocardiogram stress test and monitoring was unremarkable.  She is on beta-blocker therapy and is doing fine.  She is under stress because of the current situation and health problems.  Works at a daycare center.  At the time of my evaluation, the patient is alert awake oriented and in no distress.  She complains of palpitations on and off whenever she is stressed beta-blockers helpful  The patient does not have symptoms concerning for COVID-19 infection (fever, chills, cough, or new shortness of breath).    Past Medical History:  Diagnosis Date  . Abnormal LFTs   . Anxiety   . Asthma   . Dyspareunia in female   . Esophageal reflux   . Fatty liver   . Female hirsutism   . Heart palpitations   . Morbid obesity (HCC)   . Sinus tachycardia   . Vitamin  B 12 deficiency    Past Surgical History:  Procedure Laterality Date  . CHOLECYSTECTOMY       Current Meds  Medication Sig  . albuterol (PROAIR HFA) 108 (90 Base) MCG/ACT inhaler Inhale 2 puffs into the lungs every 4 (four) hours as needed.   . ALPRAZolam (XANAX) 0.5 MG tablet Take 0.5 mg by mouth at bedtime as needed.  Marland Kitchen. aspirin EC 81 MG tablet Take 81 mg by mouth daily.  Marland Kitchen. FLOVENT HFA 110 MCG/ACT inhaler Inhale 2 puffs into the lungs 2 (two) times daily.  . fluticasone (FLONASE) 50 MCG/ACT nasal spray Place 2 sprays into both nostrils daily.  . metoprolol tartrate (LOPRESSOR) 25 MG tablet Take 1.5 tablets by mouth daily.  Marland Kitchen. omeprazole (PRILOSEC) 40 MG capsule Take 40 mg by mouth daily.     Allergies:   Codeine; Hydrocodone-acetaminophen; and Morphine and related   Social History   Tobacco Use  . Smoking status: Never Smoker  . Smokeless tobacco: Never Used  Substance Use Topics  . Alcohol use: No  . Drug use: No     Family Hx: The patient's family history includes Heart attack in her mother; Lung cancer in her mother.  ROS:   Please see the history of present illness.    As mentioned above All other systems reviewed and are negative.   Prior CV studies:   The following studies were reviewed today:  The results of the echocardiogram stress test and  Holter were discussed with the patient  Labs/Other Tests and Data Reviewed:    EKG:  No ECG reviewed.  Recent Labs: 03/08/2018: TSH 3.070   Recent Lipid Panel No results found for: CHOL, TRIG, HDL, CHOLHDL, LDLCALC, LDLDIRECT  Wt Readings from Last 3 Encounters:  12/02/18 (!) 307 lb (139.3 kg)  03/08/18 (!) 327 lb (148.3 kg)  02/18/18 (!) 338 lb (153.3 kg)     Objective:    Vital Signs:  Ht 5\' 5"  (1.651 m)   Wt (!) 307 lb (139.3 kg)   BMI 51.09 kg/m    VITAL SIGNS:  reviewed  ASSESSMENT & PLAN:    1. Palpitations: Patient is taking 12.5 mg of metoprolol.  I have asked her to increase it to 25 mg daily  and keep herself well-hydrated.  I think an element of stress is involved in her symptomatology.  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 2. Essential hypertension: Her blood pressure is stable 3. Morbid obesity: Diet was discussed and the importance of being very aggressive with diet was suggested to her.  I also told her to exercise to the best of her ability. 4. Patient will be seen in follow-up appointment in 3 months or earlier if the patient has any concerns   COVID-19 Education: The signs and symptoms of COVID-19 were discussed with the patient and how to seek care for testing (follow up with PCP or arrange E-visit).  The importance of social distancing was discussed today.  Time:   Today, I have spent 16 minutes with the patient with telehealth technology discussing the above problems.     Medication Adjustments/Labs and Tests Ordered: Current medicines are reviewed at length with the patient today.  Concerns regarding medicines are outlined above.   Tests Ordered: No orders of the defined types were placed in this encounter.   Medication Changes: No orders of the defined types were placed in this encounter.   Disposition:  Follow up in 3 month(s)  Signed, Garwin Brothers, MD  12/02/2018 8:47 AM    [facility]

## 2019-02-25 DIAGNOSIS — Z1231 Encounter for screening mammogram for malignant neoplasm of breast: Secondary | ICD-10-CM | POA: Diagnosis not present

## 2019-03-06 ENCOUNTER — Ambulatory Visit (INDEPENDENT_AMBULATORY_CARE_PROVIDER_SITE_OTHER): Payer: BLUE CROSS/BLUE SHIELD | Admitting: Cardiology

## 2019-03-06 ENCOUNTER — Encounter: Payer: Self-pay | Admitting: Cardiology

## 2019-03-06 ENCOUNTER — Other Ambulatory Visit: Payer: Self-pay

## 2019-03-06 VITALS — HR 84 | Ht 65.0 in | Wt 330.0 lb

## 2019-03-06 DIAGNOSIS — R079 Chest pain, unspecified: Secondary | ICD-10-CM

## 2019-03-06 DIAGNOSIS — R002 Palpitations: Secondary | ICD-10-CM

## 2019-03-06 NOTE — Progress Notes (Signed)
Cardiology Office Note:    Date:  03/06/2019   ID:  Pamela Silva, DOB Apr 12, 1976, MRN [id number]  PCP:  Cyndi Bender, PA-C  Cardiologist:  Jenean Lindau, MD   Referring MD: Cyndi Bender, PA-C    ASSESSMENT:    1. Chest pain, unspecified type   2. Palpitations    PLAN:    In order of problems listed above:  1. Abnormal stress test: Primary prevention stressed with the patient.  Importance of compliance with diet and medication stressed and she vocalized understanding.  She is trying to exercise on a regular basis now.  She is against any further evaluation.  She is currently asymptomatic other than being not in the best shape and having shortness of breath when she exerts.  I discussed with the benefits of further evaluation and she has not keen on it at this time. 2. Essential hypertension: Blood pressure stable 3. I told her to have a blood work done by primary care physician for lipids.  She might need therapy in view of abnormal stress test and she agrees. 4. Patient will be seen in follow-up appointment in 2 months or earlier if the patient has any concerns    Medication Adjustments/Labs and Tests Ordered: Current medicines are reviewed at length with the patient today.  Concerns regarding medicines are outlined above.  No orders of the defined types were placed in this encounter.  No orders of the defined types were placed in this encounter.    No chief complaint on file.    History of Present Illness:    Pamela Silva is a 43 y.o. female.  Patient was evaluated by me for abnormal stress testing.  She is significantly overweight and leads a sedentary lifestyle.  She declined any further testing.  She has been active now she just has shortness of breath on exertion.  No chest pain orthopnea or PND.  At the time of my evaluation, the patient is alert awake oriented and in no distress.  Past Medical History:  Diagnosis Date  . Abnormal LFTs   . Anxiety   .  Asthma   . Dyspareunia in female   . Esophageal reflux   . Fatty liver   . Female hirsutism   . Heart palpitations   . Morbid obesity ([place])   . Sinus tachycardia   . Vitamin B 12 deficiency     Past Surgical History:  Procedure Laterality Date  . CHOLECYSTECTOMY      Current Medications: Current Meds  Medication Sig  . albuterol (PROAIR HFA) 108 (90 Base) MCG/ACT inhaler Inhale 2 puffs into the lungs every 4 (four) hours as needed.   . ALPRAZolam (XANAX) 0.5 MG tablet Take 0.5 mg by mouth at bedtime as needed.  Marland Kitchen aspirin EC 81 MG tablet Take 81 mg by mouth daily.  Marland Kitchen FLOVENT HFA 110 MCG/ACT inhaler Inhale 2 puffs into the lungs 2 (two) times daily.  . fluticasone (FLONASE) 50 MCG/ACT nasal spray Place 2 sprays into both nostrils daily.  . metoprolol succinate (TOPROL-XL) 25 MG 24 hr tablet Take 25 mg by mouth daily.  Marland Kitchen omeprazole (PRILOSEC) 40 MG capsule Take 40 mg by mouth daily.  . [DISCONTINUED] metoprolol tartrate (LOPRESSOR) 25 MG tablet Take 1 tablet (25 mg total) by mouth daily.     Allergies:   Codeine, Hydrocodone-acetaminophen, and Morphine and related   Social History   Socioeconomic History  . Marital status: Married    Spouse name: Not on file  .  Number of children: Not on file  . Years of education: Not on file  . Highest education level: Not on file  Occupational History  . Not on file  Social Needs  . Financial resource strain: Not on file  . Food insecurity    Worry: Not on file    Inability: Not on file  . Transportation needs    Medical: Not on file    Non-medical: Not on file  Tobacco Use  . Smoking status: Never Smoker  . Smokeless tobacco: Never Used  Substance and Sexual Activity  . Alcohol use: No  . Drug use: No  . Sexual activity: Not on file  Lifestyle  . Physical activity    Days per week: Not on file    Minutes per session: Not on file  . Stress: Not on file  Relationships  . Social Musicianconnections    Talks on phone: Not on file     Gets together: Not on file    Attends religious service: Not on file    Active member of club or organization: Not on file    Attends meetings of clubs or organizations: Not on file    Relationship status: Not on file  Other Topics Concern  . Not on file  Social History Narrative  . Not on file     Family History: The patient's family history includes Heart attack in her mother; Lung cancer in her mother.  ROS:   Please see the history of present illness.    All other systems reviewed and are negative.  EKGs/Labs/Other Studies Reviewed:    The following studies were reviewed today: As mentioned above   Recent Labs: 03/08/2018: TSH 3.070  Recent Lipid Panel No results found for: CHOL, TRIG, HDL, CHOLHDL, VLDL, LDLCALC, LDLDIRECT  Physical Exam:    VS:  Pulse 84   Ht 5\' 5"  (1.651 m)   Wt (!) 330 lb (149.7 kg)   BMI 54.91 kg/m     Wt Readings from Last 3 Encounters:  03/06/19 (!) 330 lb (149.7 kg)  12/02/18 (!) 307 lb (139.3 kg)  03/08/18 (!) 327 lb (148.3 kg)     GEN: Patient is in no acute distress HEENT: Normal NECK: No JVD; No carotid bruits LYMPHATICS: No lymphadenopathy CARDIAC: Hear sounds regular, 2/6 systolic murmur at the apex. RESPIRATORY:  Clear to auscultation without rales, wheezing or rhonchi  ABDOMEN: Soft, non-tender, non-distended MUSCULOSKELETAL:  No edema; No deformity  SKIN: Warm and dry NEUROLOGIC:  Alert and oriented x 3 PSYCHIATRIC:  Normal affect   Signed, Garwin Brothersajan R Revankar, MD  03/06/2019 4:12 PM    [facility]

## 2019-03-09 DIAGNOSIS — N3000 Acute cystitis without hematuria: Secondary | ICD-10-CM | POA: Diagnosis not present

## 2019-03-09 DIAGNOSIS — R350 Frequency of micturition: Secondary | ICD-10-CM | POA: Diagnosis not present

## 2019-03-09 DIAGNOSIS — F419 Anxiety disorder, unspecified: Secondary | ICD-10-CM | POA: Diagnosis not present

## 2019-03-29 DIAGNOSIS — Z0389 Encounter for observation for other suspected diseases and conditions ruled out: Secondary | ICD-10-CM | POA: Diagnosis not present

## 2019-03-29 DIAGNOSIS — M62838 Other muscle spasm: Secondary | ICD-10-CM | POA: Diagnosis not present

## 2019-03-29 DIAGNOSIS — Z6841 Body Mass Index (BMI) 40.0 and over, adult: Secondary | ICD-10-CM | POA: Diagnosis not present

## 2019-03-31 DIAGNOSIS — I493 Ventricular premature depolarization: Secondary | ICD-10-CM | POA: Diagnosis not present

## 2019-04-25 DIAGNOSIS — H532 Diplopia: Secondary | ICD-10-CM | POA: Diagnosis not present

## 2019-04-25 DIAGNOSIS — F419 Anxiety disorder, unspecified: Secondary | ICD-10-CM | POA: Diagnosis not present

## 2019-04-25 DIAGNOSIS — G43109 Migraine with aura, not intractable, without status migrainosus: Secondary | ICD-10-CM | POA: Diagnosis not present

## 2019-05-10 DIAGNOSIS — Z20828 Contact with and (suspected) exposure to other viral communicable diseases: Secondary | ICD-10-CM | POA: Diagnosis not present

## 2019-05-10 DIAGNOSIS — J324 Chronic pansinusitis: Secondary | ICD-10-CM | POA: Diagnosis not present

## 2019-05-10 DIAGNOSIS — H9209 Otalgia, unspecified ear: Secondary | ICD-10-CM | POA: Diagnosis not present

## 2019-05-11 DIAGNOSIS — H4912 Fourth [trochlear] nerve palsy, left eye: Secondary | ICD-10-CM | POA: Diagnosis not present

## 2019-05-11 DIAGNOSIS — R42 Dizziness and giddiness: Secondary | ICD-10-CM | POA: Diagnosis not present

## 2019-06-05 DIAGNOSIS — Z8669 Personal history of other diseases of the nervous system and sense organs: Secondary | ICD-10-CM | POA: Diagnosis not present

## 2019-06-05 DIAGNOSIS — R42 Dizziness and giddiness: Secondary | ICD-10-CM | POA: Diagnosis not present

## 2019-07-05 DIAGNOSIS — K219 Gastro-esophageal reflux disease without esophagitis: Secondary | ICD-10-CM | POA: Diagnosis not present

## 2019-07-05 DIAGNOSIS — J4541 Moderate persistent asthma with (acute) exacerbation: Secondary | ICD-10-CM | POA: Diagnosis not present

## 2019-07-23 DIAGNOSIS — G4733 Obstructive sleep apnea (adult) (pediatric): Secondary | ICD-10-CM | POA: Diagnosis not present

## 2021-06-05 DIAGNOSIS — R002 Palpitations: Secondary | ICD-10-CM

## 2021-06-05 DIAGNOSIS — R001 Bradycardia, unspecified: Secondary | ICD-10-CM

## 2021-06-05 DIAGNOSIS — R079 Chest pain, unspecified: Secondary | ICD-10-CM

## 2021-06-06 DIAGNOSIS — R079 Chest pain, unspecified: Secondary | ICD-10-CM | POA: Diagnosis not present

## 2021-06-06 DIAGNOSIS — R001 Bradycardia, unspecified: Secondary | ICD-10-CM | POA: Diagnosis not present

## 2021-06-06 DIAGNOSIS — R002 Palpitations: Secondary | ICD-10-CM | POA: Diagnosis not present

## 2023-10-25 DIAGNOSIS — R079 Chest pain, unspecified: Secondary | ICD-10-CM | POA: Diagnosis not present

## 2023-10-25 DIAGNOSIS — I493 Ventricular premature depolarization: Secondary | ICD-10-CM | POA: Diagnosis not present

## 2023-10-26 ENCOUNTER — Encounter (HOSPITAL_COMMUNITY): Payer: Self-pay | Admitting: Cardiology

## 2023-10-26 ENCOUNTER — Ambulatory Visit (HOSPITAL_COMMUNITY)
Admission: EM | Admit: 2023-10-26 | Discharge: 2023-10-26 | Disposition: A | Discharge status: Home / Self Care | Source: Other Acute Inpatient Hospital | Attending: Cardiology | Admitting: Cardiology

## 2023-10-26 ENCOUNTER — Encounter (HOSPITAL_COMMUNITY)
Admission: EM | Disposition: A | Discharge status: Home / Self Care | Payer: Self-pay | Source: Other Acute Inpatient Hospital | Attending: Cardiology

## 2023-10-26 DIAGNOSIS — J449 Chronic obstructive pulmonary disease, unspecified: Secondary | ICD-10-CM | POA: Diagnosis not present

## 2023-10-26 DIAGNOSIS — G8929 Other chronic pain: Secondary | ICD-10-CM | POA: Diagnosis not present

## 2023-10-26 DIAGNOSIS — K219 Gastro-esophageal reflux disease without esophagitis: Secondary | ICD-10-CM | POA: Diagnosis not present

## 2023-10-26 DIAGNOSIS — Z6841 Body Mass Index (BMI) 40.0 and over, adult: Secondary | ICD-10-CM | POA: Diagnosis not present

## 2023-10-26 DIAGNOSIS — F419 Anxiety disorder, unspecified: Secondary | ICD-10-CM | POA: Insufficient documentation

## 2023-10-26 DIAGNOSIS — Z79899 Other long term (current) drug therapy: Secondary | ICD-10-CM | POA: Insufficient documentation

## 2023-10-26 DIAGNOSIS — R079 Chest pain, unspecified: Secondary | ICD-10-CM | POA: Diagnosis present

## 2023-10-26 HISTORY — PX: LEFT HEART CATH AND CORONARY ANGIOGRAPHY: CATH118249

## 2023-10-26 SURGERY — LEFT HEART CATH AND CORONARY ANGIOGRAPHY
Anesthesia: LOCAL

## 2023-10-26 MED ORDER — SODIUM CHLORIDE 0.9% FLUSH: 3.0000 mL | Freq: Two times a day (BID) | INTRAVENOUS | Status: DC

## 2023-10-26 MED ORDER — SODIUM CHLORIDE 0.9 % IV SOLN: 250.0000 mL | INTRAVENOUS | Status: DC | PRN

## 2023-10-26 MED ORDER — HEPARIN (PORCINE) IN NACL 1000-0.9 UT/500ML-% IV SOLN
INTRAVENOUS | Status: DC | PRN
  Administered 2023-10-26 (×2): 500 mL

## 2023-10-26 MED ORDER — ONDANSETRON HCL 4 MG/2ML IJ SOLN: 4.0000 mg | Freq: Four times a day (QID) | INTRAMUSCULAR | Status: DC | PRN

## 2023-10-26 MED ORDER — IOHEXOL 350 MG/ML SOLN
INTRAVENOUS | Status: DC | PRN
  Administered 2023-10-26: 35 mL

## 2023-10-26 MED ORDER — FENTANYL CITRATE (PF) 100 MCG/2ML IJ SOLN
INTRAMUSCULAR | Status: AC
  Filled 2023-10-26: qty 2

## 2023-10-26 MED ORDER — FENTANYL CITRATE (PF) 100 MCG/2ML IJ SOLN
INTRAMUSCULAR | Status: DC | PRN
  Administered 2023-10-26 (×2): 25 ug via INTRAVENOUS

## 2023-10-26 MED ORDER — HYDRALAZINE HCL 20 MG/ML IJ SOLN: 10.0000 mg | INTRAMUSCULAR | Status: DC | PRN

## 2023-10-26 MED ORDER — VERAPAMIL HCL 2.5 MG/ML IV SOLN
INTRAVENOUS | Status: DC | PRN
  Administered 2023-10-26: 10 mL via INTRA_ARTERIAL

## 2023-10-26 MED ORDER — ACETAMINOPHEN 325 MG PO TABS: 650.0000 mg | ORAL_TABLET | ORAL | Status: DC | PRN

## 2023-10-26 MED ORDER — VERAPAMIL HCL 2.5 MG/ML IV SOLN
INTRAVENOUS | Status: AC
  Filled 2023-10-26: qty 2

## 2023-10-26 MED ORDER — HEPARIN SODIUM (PORCINE) 1000 UNIT/ML IJ SOLN
INTRAMUSCULAR | Status: AC
Start: 2023-10-26 — End: ?
  Filled 2023-10-26: qty 10

## 2023-10-26 MED ORDER — HEPARIN SODIUM (PORCINE) 1000 UNIT/ML IJ SOLN
INTRAMUSCULAR | Status: DC | PRN
  Administered 2023-10-26: 6000 [IU] via INTRAVENOUS

## 2023-10-26 MED ORDER — LIDOCAINE HCL (PF) 1 % IJ SOLN
INTRAMUSCULAR | Status: DC | PRN
  Administered 2023-10-26: 2 mL

## 2023-10-26 MED ORDER — MIDAZOLAM HCL 2 MG/2ML IJ SOLN
INTRAMUSCULAR | Status: DC | PRN
  Administered 2023-10-26: 2 mg via INTRAVENOUS

## 2023-10-26 MED ORDER — LIDOCAINE HCL (PF) 1 % IJ SOLN
INTRAMUSCULAR | Status: AC
  Filled 2023-10-26: qty 30

## 2023-10-26 MED ORDER — MIDAZOLAM HCL 2 MG/2ML IJ SOLN
INTRAMUSCULAR | Status: AC
  Filled 2023-10-26: qty 2

## 2023-10-26 MED ORDER — SODIUM CHLORIDE 0.9% FLUSH: 3.0000 mL | INTRAVENOUS | Status: DC | PRN

## 2023-10-26 SURGICAL SUPPLY — 10 items
CATH INFINITI 5 FR JL3.5 (CATHETERS) IMPLANT
CATH INFINITI JR4 5F (CATHETERS) IMPLANT
DEVICE RAD COMP TR BAND LRG (VASCULAR PRODUCTS) IMPLANT
GLIDESHEATH SLEND SS 6F .021 (SHEATH) IMPLANT
GUIDEWIRE INQWIRE 1.5J.035X260 (WIRE) IMPLANT
INQWIRE 1.5J .035X260CM (WIRE) ×1 IMPLANT
MAT PREVALON FULL STRYKER (MISCELLANEOUS) IMPLANT
PACK CARDIAC CATHETERIZATION (CUSTOM PROCEDURE TRAY) ×1 IMPLANT
SET ATX-X65L (MISCELLANEOUS) IMPLANT
SHEATH PROBE COVER 6X72 (BAG) IMPLANT

## 2023-10-26 NOTE — Interval H&P Note (Signed)
 History and Physical Interval Note:  10/26/2023 4:02 PM  Pamela Silva  has presented today for surgery, with the diagnosis of nstemi.  The various methods of treatment have been discussed with the patient and family. After consideration of risks, benefits and other options for treatment, the patient has consented to  Procedure(s): LEFT HEART CATH AND CORONARY ANGIOGRAPHY (N/A) as a surgical intervention.  The patient's history has been reviewed, patient examined, no change in status, stable for surgery.  I have reviewed the patient's chart and labs.  Questions were answered to the patient's satisfaction.   Cath Lab Visit (complete for each Cath Lab visit)  Clinical Evaluation Leading to the Procedure:   ACS: Yes.    Non-ACS:    Anginal Classification: CCS III  Anti-ischemic medical therapy: Minimal Therapy (1 class of medications)  Non-Invasive Test Results: Low-risk stress test findings: cardiac mortality <1%/year  Prior CABG: No previous CABG        Theron Arista [facility] 10/26/2023 4:02 PM

## 2023-10-26 NOTE — Progress Notes (Signed)
 Patient transferred from [facility] for cardiac cath for evaluation of chest pain. Seen by our service there and records in paper chart  Asley Baskerville Sw[place] MD, [facility]

## 2023-10-26 NOTE — H&P (View-Only) (Signed)
 Patient transferred from [facility] for cardiac cath for evaluation of chest pain. Seen by our service there and records in paper chart  Asley Baskerville [place] MD, [facility]

## 2023-10-26 NOTE — Discharge Summary (Signed)
 Discharge Summary    Patient ID: Pamela Silva MRN: [id number]; DOB: Sep 19, 1975  Admit date: 10/26/2023 Discharge date: 10/26/2023  PCP:  Audie Pinto, FNP   [facility] Providers Cardiologist:  Garwin Brothers, MD      Discharge Diagnoses    Active Problems:   Chest pain  Diagnostic Studies/Procedures    Cath: 10/26/2023    The left ventricular systolic function is normal.   LV end diastolic pressure is normal.   The left ventricular ejection fraction is 55-65% by visual estimate.   Normal coronary anatomy Normal LV function Normal LVEDP   Plan: consider other causes of chest pain   _____________   History of Present Illness     Pamela Silva is a 48 y.o. female with PMH of chronic back pain, COPD, anxiety, PVCs who has been followed by Dr. Tomie China as an outpatient.  Prior cardiac catheterization in 2011 with nonobstructive disease reported. Prior history of negative stress test.  Evaluated at [facility] for vasovagal syncope.  Most recently was [facility] for palpitations at an outpatient visit.  It was recommended that she have an echocardiogram, decision to proceed with further ischemic evaluation was deferred at that time pending echocardiogram.  Presented to [facility] on 3/23 with chest pain with radiation to the left side of her chest and shoulder and minor shortness of breath.  Reported this was the same chest pain she had off and on for the past few years.  In the ED her labs showed sodium 141, potassium 3.6, creatinine 0.7, magnesium 2.1, troponin I negative x 3, WBC 7.7, hemoglobin 14, globin A1c 5.5.  She was evaluated by cardiology with recommendations for echocardiogram which showed LVEF of 55 to 60%, normal RV, mild dilation of the left atrium, trivial MR.  Underwent Lexiscan Myoview with mild ischemia involving the apical portion of the anterior septal wall.  Therefore recommendations to transfer to [facility] for  further evaluation with definitive cardiac catheterization.  Hospital Course     Chest pain -- As noted above negative troponin I x 3 while at [facility].  Echocardiogram with normal LV function, normal RV and no regional wall motion abnormality.  Lexiscan Myoview with concern for mild ischemia involving the apical portion of anterior septal wall. -- Underwent definitive cardiac catheterization normal coronary anatomy, normal LVEDP, normal LV function.  Suspected noncardiac etiology for symptoms.  Bradycardia --Of the same, no concerning arrhythmias noted during hospital admission while at [facility]. -- maintained on Toprol XL 25mg  daily   COPD -- Continue home medications  Morbid obesity -- BMI 54 -- Continue with diet and exercise/lifestyle modifications -- Consider GLP-1 as an outpatient  GERD -- continue Prilosec   Chronic pain Anxiety   Patient seen by Dr. Swaziland and deemed stable for discharge home post cardiac catheterization.  She reports her husband will be staying with her at the time of discharge.  Follow-up arranged in the office. _____________  Discharge Vitals Blood pressure 133/72, pulse 65, resp. rate 16, height 5\' 5"  (1.651 m), weight (!) 146.5 kg, SpO2 99%.  Filed Weights   10/26/23 1213  Weight: (!) 146.5 kg    Labs & Radiologic Studies    CBC No results for input(s): "WBC", "NEUTROABS", "HGB", "HCT", "MCV", "PLT" in the last 72 hours. Basic Metabolic Panel No results for input(s): "NA", "K", "CL", "CO2", "GLUCOSE", "BUN", "CREATININE", "CALCIUM", "MG", "PHOS" in the last 72 hours. Liver Function Tests No results for  input(s): "AST", "ALT", "ALKPHOS", "BILITOT", "PROT", "ALBUMIN" in the last 72 hours. No results for input(s): "LIPASE", "AMYLASE" in the last 72 hours. High Sensitivity Troponin:   No results for input(s): "TROPONINIHS" in the last 720 hours.  BNP Invalid input(s): "POCBNP" D-Dimer No results for input(s): "DDIMER" in  the last 72 hours. Hemoglobin A1C No results for input(s): "HGBA1C" in the last 72 hours. Fasting Lipid Panel No results for input(s): "CHOL", "HDL", "LDLCALC", "TRIG", "CHOLHDL", "LDLDIRECT" in the last 72 hours. Thyroid Function Tests No results for input(s): "TSH", "T4TOTAL", "T3FREE", "THYROIDAB" in the last 72 hours.  Invalid input(s): "FREET3" _____________  CARDIAC CATHETERIZATION Result Date: 10/26/2023   The left ventricular systolic function is normal.   LV end diastolic pressure is normal.   The left ventricular ejection fraction is 55-65% by visual estimate. Normal coronary anatomy Normal LV function Normal LVEDP Plan: consider other causes of chest pain   Disposition   Pt is being discharged home today in good condition.  Follow-up Plans & Appointments        Discharge Medications   Allergies as of 10/26/2023       Reactions   Codeine Itching   Hydrocodone-acetaminophen Itching   Morphine And Codeine    Nausea        Medication List     TAKE these medications    ALPRAZolam 0.5 MG tablet Commonly known as: XANAX Take 0.5 mg by mouth at bedtime as needed.   aspirin EC 81 MG tablet Take 81 mg by mouth daily.   Flovent HFA 110 MCG/ACT inhaler Generic drug: fluticasone Inhale 2 puffs into the lungs 2 (two) times daily.   fluticasone 50 MCG/ACT nasal spray Commonly known as: FLONASE Place 2 sprays into both nostrils daily.   metoprolol succinate 25 MG 24 hr tablet Commonly known as: TOPROL-XL Take 25 mg by mouth daily.   omeprazole 40 MG capsule Commonly known as: PRILOSEC Take 40 mg by mouth daily.   ProAir HFA 108 (90 Base) MCG/ACT inhaler Generic drug: albuterol Inhale 2 puffs into the lungs every 4 (four) hours as needed.          Outstanding Labs/Studies   N/a   Duration of Discharge Encounter: APP Time: 23 minutes   Signed, Laverda Page, NP 10/26/2023, 4:48 PM

## 2023-11-05 DIAGNOSIS — R002 Palpitations: Secondary | ICD-10-CM | POA: Insufficient documentation

## 2023-11-05 DIAGNOSIS — K760 Fatty (change of) liver, not elsewhere classified: Secondary | ICD-10-CM | POA: Insufficient documentation

## 2023-11-05 DIAGNOSIS — F419 Anxiety disorder, unspecified: Secondary | ICD-10-CM | POA: Insufficient documentation

## 2023-11-05 DIAGNOSIS — J45909 Unspecified asthma, uncomplicated: Secondary | ICD-10-CM | POA: Insufficient documentation

## 2023-11-08 NOTE — Progress Notes (Deleted)
  Cardiology Office Note:  .   Date:  11/08/2023  ID:  Pamela Silva, DOB [birth year], MRN [id number] PCP: Audie Pinto, FNP  [facility] Providers Cardiologist:  Garwin Brothers, MD { Click to update primary MD,subspecialty MD or APP then REFRESH:1}   History of Present Illness: .   Pamela Silva is a 48 y.o. female with a past medical history of asthma, esophageal reflux, palpitations, morbid obesity, PVCs, COPD, chronic back pain.  10/26/2023 left heart cath normal coronary arteries 10/25/2023 MPI No ischemic changes 10/25/2023 echo EF 55-60%, global dysfunction, trivial MR 2011 cardiac cath at [facility] records available for review but reports indicate nonobstructive disease  She [facility] with [facility] in 2019 at the behest of her PCP for evaluation of palpitations. It appears she followed laterally with Dr. Desma Maxim as well, she was most recently evaluated by Dr. Judithe Modest on 10/13/2023, she continued to be bothered by palpitations and it appears an echo was arranged as well as a repeat monitor.  She presented [facility] on 323 with chest pain radiated to her left side, troponins were negative, she was evaluated by cardiology with recommendations for echocardiogram which was overall unrevealing.  She underwent a Lexiscan with mild ischemia and was recommended transfer to [facility] for left heart cath which was completed on 10/26/2023 revealing normal coronary arteries.  LDL 82 11/03/23, labs good  ROS: ***  Studies Reviewed: .        *** Risk Assessment/Calculations:   {Does this patient have ATRIAL FIBRILLATION?:[phone]} No BP recorded.  {Refresh Note OR Click here to enter BP  :1}***       Physical Exam:   VS:  There were no vitals taken for this visit.   Wt Readings from Last 3 Encounters:  10/26/23 (!) 323 lb (146.5 kg)  03/06/19 (!) 330 lb (149.7 kg)  12/02/18 (!) 307 lb (139.3 kg)    GEN: Well nourished, well developed in no acute  distress NECK: No JVD; No carotid bruits CARDIAC: ***RRR, no murmurs, rubs, gallops RESPIRATORY:  Clear to auscultation without rales, wheezing or rhonchi  ABDOMEN: Soft, non-tender, non-distended EXTREMITIES:  No edema; No deformity   ASSESSMENT AND PLAN: .   ***    {Are you ordering a CV Procedure (e.g. stress test, cath, DCCV, TEE, etc)?   Press F2        :[id number]}  Dispo: ***  Signed, Flossie Dibble, NP

## 2023-11-09 ENCOUNTER — Ambulatory Visit: Attending: Cardiology | Admitting: Cardiology

## 2023-11-09 DIAGNOSIS — E785 Hyperlipidemia, unspecified: Secondary | ICD-10-CM

## 2023-11-09 DIAGNOSIS — R002 Palpitations: Secondary | ICD-10-CM

## 2023-11-09 DIAGNOSIS — E669 Obesity, unspecified: Secondary | ICD-10-CM
